# Patient Record
Sex: Male | Born: 2000 | Race: Black or African American | Hispanic: No | Marital: Single
Health system: Midwestern US, Community
[De-identification: ages and names within clinical notes are randomized; demographics above are authoritative.]

---
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
## 2019-01-26 DIAGNOSIS — Z20828 Contact with and (suspected) exposure to other viral communicable diseases: Secondary | ICD-10-CM | POA: Diagnosis not present

## 2019-05-16 DIAGNOSIS — R591 Generalized enlarged lymph nodes: Secondary | ICD-10-CM | POA: Diagnosis not present

## 2019-05-16 DIAGNOSIS — Z202 Contact with and (suspected) exposure to infections with a predominantly sexual mode of transmission: Secondary | ICD-10-CM | POA: Diagnosis not present

## 2019-05-16 DIAGNOSIS — R1909 Other intra-abdominal and pelvic swelling, mass and lump: Secondary | ICD-10-CM | POA: Diagnosis not present

## 2019-05-16 DIAGNOSIS — F17200 Nicotine dependence, unspecified, uncomplicated: Secondary | ICD-10-CM | POA: Diagnosis not present

## 2019-05-16 DIAGNOSIS — L02214 Cutaneous abscess of groin: Secondary | ICD-10-CM | POA: Diagnosis not present

## 2019-07-14 DIAGNOSIS — G8929 Other chronic pain: Secondary | ICD-10-CM | POA: Diagnosis not present

## 2019-07-14 DIAGNOSIS — R109 Unspecified abdominal pain: Secondary | ICD-10-CM | POA: Diagnosis not present

## 2019-07-14 DIAGNOSIS — R195 Other fecal abnormalities: Secondary | ICD-10-CM | POA: Diagnosis not present

## 2019-07-14 DIAGNOSIS — R197 Diarrhea, unspecified: Secondary | ICD-10-CM | POA: Diagnosis not present

## 2019-07-14 DIAGNOSIS — F17200 Nicotine dependence, unspecified, uncomplicated: Secondary | ICD-10-CM | POA: Diagnosis not present

## 2019-09-29 DIAGNOSIS — Z113 Encounter for screening for infections with a predominantly sexual mode of transmission: Secondary | ICD-10-CM | POA: Diagnosis not present

## 2019-10-24 DIAGNOSIS — R42 Dizziness and giddiness: Secondary | ICD-10-CM | POA: Diagnosis not present

## 2019-10-24 DIAGNOSIS — E162 Hypoglycemia, unspecified: Secondary | ICD-10-CM | POA: Diagnosis not present

## 2019-12-10 DIAGNOSIS — R21 Rash and other nonspecific skin eruption: Secondary | ICD-10-CM | POA: Diagnosis not present

## 2020-01-25 DIAGNOSIS — R21 Rash and other nonspecific skin eruption: Secondary | ICD-10-CM | POA: Diagnosis not present

## 2020-02-01 DIAGNOSIS — J029 Acute pharyngitis, unspecified: Secondary | ICD-10-CM | POA: Diagnosis not present

## 2020-02-01 DIAGNOSIS — Z1152 Encounter for screening for COVID-19: Secondary | ICD-10-CM | POA: Diagnosis not present

## 2020-02-01 DIAGNOSIS — Z20822 Contact with and (suspected) exposure to covid-19: Secondary | ICD-10-CM | POA: Diagnosis not present

## 2020-02-01 DIAGNOSIS — J020 Streptococcal pharyngitis: Secondary | ICD-10-CM | POA: Diagnosis not present

## 2020-02-01 DIAGNOSIS — R0981 Nasal congestion: Secondary | ICD-10-CM | POA: Diagnosis not present

## 2020-03-06 DIAGNOSIS — L309 Dermatitis, unspecified: Secondary | ICD-10-CM | POA: Diagnosis not present

## 2020-03-06 DIAGNOSIS — L810 Postinflammatory hyperpigmentation: Secondary | ICD-10-CM | POA: Diagnosis not present

## 2020-03-06 DIAGNOSIS — L301 Dyshidrosis [pompholyx]: Secondary | ICD-10-CM | POA: Diagnosis not present

## 2020-03-06 DIAGNOSIS — L853 Xerosis cutis: Secondary | ICD-10-CM | POA: Diagnosis not present

## 2020-05-05 DIAGNOSIS — R110 Nausea: Secondary | ICD-10-CM | POA: Diagnosis not present

## 2020-08-11 DIAGNOSIS — R0602 Shortness of breath: Secondary | ICD-10-CM | POA: Diagnosis not present

## 2020-08-11 DIAGNOSIS — Z79899 Other long term (current) drug therapy: Secondary | ICD-10-CM | POA: Diagnosis not present

## 2020-08-11 DIAGNOSIS — R110 Nausea: Secondary | ICD-10-CM | POA: Diagnosis not present

## 2020-08-11 DIAGNOSIS — R531 Weakness: Secondary | ICD-10-CM | POA: Diagnosis not present

## 2020-08-11 DIAGNOSIS — F17210 Nicotine dependence, cigarettes, uncomplicated: Secondary | ICD-10-CM | POA: Diagnosis not present

## 2020-08-11 DIAGNOSIS — Z20822 Contact with and (suspected) exposure to covid-19: Secondary | ICD-10-CM | POA: Diagnosis not present

## 2020-08-11 DIAGNOSIS — F419 Anxiety disorder, unspecified: Secondary | ICD-10-CM | POA: Diagnosis not present

## 2020-08-11 NOTE — Unmapped (Signed)
Formatting of this note is different from the original.    Patient Education Instructions   Name:   Joseph Giles, Joseph Giles Current Date:  08/11/2020 14:45:11   MRN: [id number]     FIN:  [id number]   The following sheet(s) are the Patient Education Leaflets for  Joseph Giles, Joseph Giles [facility]         Managing Anxiety, Adult    After being diagnosed with an anxiety disorder, you may be relieved to know why you have felt or behaved a certain way. You may also feel overwhelmed about the treatment ahead and what it will mean for your life. With care and support, you can manage this condition and recover from it.      How to manage lifestyle changes    Managing stress and anxiety    Stress is your body's reaction to life changes and events, both good and bad. Most stress will last just a few hours, but stress can be ongoing and can lead to more than just stress. Although stress can play a major role in anxiety, it is not the same as anxiety. Stress is usually caused by something external, such as a deadline, test, or competition. Stress normally passes after the triggering event has ended.     Anxiety is caused by something internal, such as imagining a terrible outcome or worrying that something will go wrong that will devastate you. Anxiety often does not go away even after the triggering event is over, and it can become long-term (chronic) worry. It is important to understand the differences between stress and anxiety and to manage your stress effectively so that it does not lead to an anxious response.    Talk with your health care provider or a counselor to learn more about reducing anxiety and stress. He or she may suggest tension reduction techniques, such as:   Music therapy. This can include creating or listening to music that you enjoy and that inspires you.     Mindfulness-based meditation. This involves being aware of your normal breaths while not trying to control your breathing. It can be done while sitting or walking.      Centering prayer. This involves focusing on a word, phrase, or sacred image that means something to you and brings you peace.     Deep breathing. To do this, expand your stomach and inhale slowly through your nose. Hold your breath for 3?5 seconds. Then exhale slowly, letting your stomach muscles relax.     Self-talk. This involves identifying thought patterns that lead to anxiety reactions and changing those patterns.     Muscle relaxation. This involves tensing muscles and then relaxing them.      Choose a tension reduction technique that suits your lifestyle and personality. These techniques take time and practice. Set aside 5?15 minutes a day to do them. Therapists can offer counseling and training in these techniques. The training to help with anxiety may be covered by some insurance plans. Other things you can do to manage stress and anxiety include:   Keeping a stress/anxiety diary. This can help you learn what triggers your reaction and then learn ways to manage your response.     Thinking about how you react to certain situations. You may not be able to control everything, but you can control your response.     Making time for activities that help you relax and not feeling guilty about spending your time in this way.     Visual imagery and yoga can  help you stay calm and relax.        Medicines    Medicines can help ease symptoms. Medicines for anxiety include:   Anti-anxiety drugs.     Antidepressants.      Medicines are often used as a primary treatment for anxiety disorder. Medicines will be prescribed by a health care provider. When used together, medicines, psychotherapy, and tension reduction techniques may be the most effective treatment.      Relationships    Relationships can play a big part in helping you recover. Try to spend more time connecting with trusted friends and family members. Consider going to couples counseling, taking family education classes, or going to family therapy. Therapy can  help you and others better understand your condition.        How to recognize changes in your anxiety    Everyone responds differently to treatment for anxiety. Recovery from anxiety happens when symptoms decrease and stop interfering with your daily activities at home or work. This may mean that you will start to:   Have better concentration and focus. Worry will interfere less in your daily thinking.     Sleep better.     Be less irritable.     Have more energy.     Have improved memory.      It is important to recognize when your condition is getting worse. Contact your health care provider if your symptoms interfere with home or work and you feel like your condition is not improving.      Follow these instructions at home:    Activity     Exercise. Most adults should do the following:  ? Exercise for at least 150 minutes each week. The exercise should increase your heart rate and make you sweat (moderate-intensity exercise).    ? Strengthening exercises at least twice a week.       Get the right amount and quality of sleep. Most adults need 7?9 hours of sleep each night.      Lifestyle     Eat a healthy diet that includes plenty of vegetables, fruits, whole grains, low-fat dairy products, and lean protein. Do not eat a lot of foods that are high in solid fats, added sugars, or salt.     Make choices that simplify your life.     Do not use any products that contain nicotine or tobacco, such as cigarettes, e-cigarettes, and chewing tobacco. If you need help quitting, ask your health care provider.     Avoid caffeine, alcohol, and certain over-the-counter cold medicines. These may make you feel worse. Ask your pharmacist which medicines to avoid.      General instructions     Take over-the-counter and prescription medicines only as told by your health care provider.     Keep all follow-up visits as told by your health care provider. This is important.        Where to find support    You can get help and support  from these sources:   Self-help groups.     Online and [employer].     A trusted spiritual leader.     Couples counseling.     Family education classes.     Family therapy.        Where to find more information    You may find that joining a support group helps you deal with your anxiety. The following sources can help you locate counselors or support groups near  you:   Mental Health [place]: www.mentalhealthamerica.net     Anxiety and Depression Association of [place] (ADAA): ProgramCam.de     [employer] Mental [employer]ness (NAMI): www.nami.org        Contact a health care provider if you:     Have a hard time staying focused or finishing daily tasks.     Spend many hours a day feeling worried about everyday life.     Become exhausted by worry.     Start to have headaches, feel tense, or have nausea.     Urinate more than normal.     Have diarrhea.      Get help right away if you have:     A racing heart and shortness of breath.     Thoughts of hurting yourself or others.    If you ever feel like you may hurt yourself or others, or have thoughts about taking your own life, get help right away. You can go to your nearest emergency department or call:    Your local emergency services (911 in the [place]     A suicide crisis helpline, such as the National Suicide Prevention Lifeline at 347-876-8431. This is open 24 hours a day.        Summary     Taking steps to learn and use tension reduction techniques can help calm you and help prevent triggering an anxiety reaction.     When used together, medicines, psychotherapy, and tension reduction techniques may be the most effective treatment.     Family, friends, and partners can play a big part in helping you recover from an anxiety disorder.      This information is not intended to replace advice given to you by your health care provider. Make sure you discuss any questions you have with your health care provider.      Document Revised: 08/11/2018  Document Reviewed: 08/11/2018  Elsevier Patient Education ? 2021 [employer]          Electronically signed by McLeod-Ecw, Notes at 02/21/2021  2:22 AM EST

## 2020-08-11 NOTE — Unmapped (Signed)
Formatting of this note is different from the original.    ;       [facility]   [address]   [place], [place] [address]   [phone]   Discharge Instructions (Patient)   _______________________________________    Name: Joseph Giles, Joseph Giles                          Current Date: 08/11/2020 14:45:10  DOB:  Aug 09, 2000                              MRN: [id number]                   FIN: [id number]  Diagnosis: 1:Anxiety    Visit Date: 08/11/2020 09:09:30 [place]  Address: [address] [place] [id number]  Phone:     Primary Care Provider:      Name:       Phone:        Emergency Department Providers:          Primary Physician:       Comment:     Tonye Becket has been given the following list of follow-up instructions, prescriptions, and patient education materials:    Follow-up Instructions:      With: Address: When:   Return to Emergency Department  Within 3 to 5 days   Comments:   Take all medications as prescribed.  Follow-up with primary care provider in 3 to 5 days for recheck.  Return for new or worsening symptoms.         Patient Education Materials:  Managing Anxiety, Adult        Managing Anxiety, Adult    After being diagnosed with an anxiety disorder, you may be relieved to know why you have felt or behaved a certain way. You may also feel overwhelmed about the treatment ahead and what it will mean for your life. With care and support, you can manage this condition and recover from it.      How to manage lifestyle changes    Managing stress and anxiety    Stress is your body's reaction to life changes and events, both good and bad. Most stress will last just a few hours, but stress can be ongoing and can lead to more than just stress. Although stress can play a major role in anxiety, it is not the same as anxiety. Stress is usually caused by something external, such as a deadline, test, or competition. Stress normally passes after the triggering event has ended.      Anxiety is caused by something internal, such as imagining a terrible outcome or worrying that something will go wrong that will devastate you. Anxiety often does not go away even after the triggering event is over, and it can become long-term (chronic) worry. It is important to understand the differences between stress and anxiety and to manage your stress effectively so that it does not lead to an anxious response.    Talk with your health care provider or a counselor to learn more about reducing anxiety and stress. He or she may suggest tension reduction techniques, such as:   Music therapy. This can include creating or listening to music that you enjoy and that inspires you.     Mindfulness-based meditation. This involves being aware of your normal breaths while not trying to control your breathing.  It can be done while sitting or walking.     Centering prayer. This involves focusing on a word, phrase, or sacred image that means something to you and brings you peace.     Deep breathing. To do this, expand your stomach and inhale slowly through your nose. Hold your breath for 3?5 seconds. Then exhale slowly, letting your stomach muscles relax.     Self-talk. This involves identifying thought patterns that lead to anxiety reactions and changing those patterns.     Muscle relaxation. This involves tensing muscles and then relaxing them.      Choose a tension reduction technique that suits your lifestyle and personality. These techniques take time and practice. Set aside 5?15 minutes a day to do them. Therapists can offer counseling and training in these techniques. The training to help with anxiety may be covered by some insurance plans. Other things you can do to manage stress and anxiety include:   Keeping a stress/anxiety diary. This can help you learn what triggers your reaction and then learn ways to manage your response.     Thinking about how you react to certain situations. You may not be able to  control everything, but you can control your response.     Making time for activities that help you relax and not feeling guilty about spending your time in this way.     Visual imagery and yoga can help you stay calm and relax.        Medicines    Medicines can help ease symptoms. Medicines for anxiety include:   Anti-anxiety drugs.     Antidepressants.      Medicines are often used as a primary treatment for anxiety disorder. Medicines will be prescribed by a health care provider. When used together, medicines, psychotherapy, and tension reduction techniques may be the most effective treatment.      Relationships    Relationships can play a big part in helping you recover. Try to spend more time connecting with trusted friends and family members. Consider going to couples counseling, taking family education classes, or going to family therapy. Therapy can help you and others better understand your condition.        How to recognize changes in your anxiety    Everyone responds differently to treatment for anxiety. Recovery from anxiety happens when symptoms decrease and stop interfering with your daily activities at home or work. This may mean that you will start to:   Have better concentration and focus. Worry will interfere less in your daily thinking.     Sleep better.     Be less irritable.     Have more energy.     Have improved memory.      It is important to recognize when your condition is getting worse. Contact your health care provider if your symptoms interfere with home or work and you feel like your condition is not improving.      Follow these instructions at home:    Activity     Exercise. Most adults should do the following:  ? Exercise for at least 150 minutes each week. The exercise should increase your heart rate and make you sweat (moderate-intensity exercise).    ? Strengthening exercises at least twice a week.       Get the right amount and quality of sleep. Most adults need 7?9 hours of sleep  each night.      Lifestyle     Eat a healthy  diet that includes plenty of vegetables, fruits, whole grains, low-fat dairy products, and lean protein. Do not eat a lot of foods that are high in solid fats, added sugars, or salt.     Make choices that simplify your life.     Do not use any products that contain nicotine or tobacco, such as cigarettes, e-cigarettes, and chewing tobacco. If you need help quitting, ask your health care provider.     Avoid caffeine, alcohol, and certain over-the-counter cold medicines. These may make you feel worse. Ask your pharmacist which medicines to avoid.      General instructions     Take over-the-counter and prescription medicines only as told by your health care provider.     Keep all follow-up visits as told by your health care provider. This is important.        Where to find support    You can get help and support from these sources:   Self-help groups.     Online and [employer]community organizations.     A trusted spiritual leader.     Couples counseling.     Family education classes.     Family therapy.        Where to find more information    You may find that joining a support group helps you deal with your anxiety. The following sources can help you locate counselors or support groups near you:   Mental Health America: www.mentalhealthamerica.net     Anxiety and Depression Association of [place] (ADAA): ProgramCam.dewww.adaa.org     The First Americanational Alliance on Mental Illness (NAMI): www.nami.org        Contact a health care provider if you:     Have a hard time staying focused or finishing daily tasks.     Spend many hours a day feeling worried about everyday life.     Become exhausted by worry.     Start to have headaches, feel tense, or have nausea.     Urinate more than normal.     Have diarrhea.      Get help right away if you have:     A racing heart and shortness of breath.     Thoughts of hurting yourself or others.    If you ever feel like you may hurt yourself or others, or have thoughts  about taking your own life, get help right away. You can go to your nearest emergency department or call:    Your local emergency services (911 in the [place]     A suicide crisis helpline, such as the National Suicide Prevention Lifeline at (925)290-85741-347-708-4218. This is open 24 hours a day.        Summary     Taking steps to learn and use tension reduction techniques can help calm you and help prevent triggering an anxiety reaction.     When used together, medicines, psychotherapy, and tension reduction techniques may be the most effective treatment.     Family, friends, and partners can play a big part in helping you recover from an anxiety disorder.      This information is not intended to replace advice given to you by your health care provider. Make sure you discuss any questions you have with your health care provider.      Document Revised: 08/11/2018 Document Reviewed: 08/11/2018  Elsevier Patient Education ? 2021 Elsevier Inc.        Allergies: No Known Allergies     Medication Information:  [facility]  Physicians provided you with a complete list of medications post discharge.  If you have been instructed to stop taking a medication, please ensure you also follow up with this information to your Primary Care Physician.  Unless otherwise noted, please continue to take medications as prescribed prior to your Emergency Room visit.  Any specific questions regarding your chronic medications and dosages should be discussed with your physician(s) and pharmacist.           New Medications and Prescriptions  Printed Prescriptions  busPIRone (busPIRone 5 mg oral tablet) 1 tab Oral (given by mouth) 2 times a day for 5 Days. Refills: 0.  Last Dose:____________________Next Dose:____________________      Comment:       General Instructions for Healthy Living and Maintaining My Health  IS IT A STROKE?   Act FAST and Check for these signs:      FACE Does the face look uneven?       ARM Does one arm drift down?       SPEECH Does  their speech sound strange?       TIME Call 9-1-1 at any sign of stroke     Heart Attack Signs  Chest discomfort: Most heart attacks involve discomfort in the center of the chest and lasts more than a few minutes,or goes away and comes back. It can feel like uncomfortable pressure, squeezing, fullness or pain.  Discomfort in upper body: Symptoms can include pain or discomfort in one or both arms, back, neck, jaw or stomach.  Shortness of breath:With or without discomfort.  Other signs: Breaking out in a cold sweat, nausea, or light-headed.  MINUTES DO MATTER! If you experience any of these heart attack warning signs, call 9-1-1 for immediate medical attention!    Smoking:  - No smoking / No Tobacco Products / Avoid Exposure to Second Hand Smoke  - Surgeon General's Warning: Quitting smoking now greatly reduces serious risk to your health.    Obesity / Weight Monitoring:  - Obesity greatly increases your risk of illness  - A healthy diet, regular physical exercise and weight monitoring are important for maintaining a healthy lifestyle.  - You may be retaining fluid if you have history of congestive heart failure or experience any of the following symptoms:    - Weight gain of 3-5 lbs in 1-2 days,swelling in your hands or feet or shortness of breath while lying flat in bed.  **Please call your doctor as soon as you notice any of these symptoms; do not wait until your next office visit.**    Reduce potential for lethal means  Family/Friends should help in securing or removing from house or car all means of committing suicide impulsively.   In Case of Emergency Dial 911 or nearest Emergency Room  National Suicide Prevention Life Line    www.SuicidePreventionLifeLine.org    1-800-273-TALK (8255)   National Alliance on Mental Illness      www.nami.org    989-034-9773 or (1-647-113-3147)   Substance Abuse and Mental Health Services Administration    1-877-SAMHSA-7         CelebSpecial.com.pt     [phone])       **For Help with Substance Abuse please contact the resource below most convenient to you:**  Substance Abuse Treatment (Outpatient)  Location / Organization Name    Phone Number            [facility]    [phone]   [facility]  [phone]   [facility] Substance Use Disorder Treatment/MAT Program    [phone]   [facility]    [phone]   [facility]    [phone]   [facility]    [phone]   [facility] [place] [facility] [place]    [phone]              **[address] [place]**    [facility]    [phone]            **[facility]**    [facility]    [phone]            **[place]**    [facility] [place]    [phone]   [place]    [phone]   [facility] [place] [facility] [place]    [phone]            [facility]**    [facility]    [phone]   Center for [facility]    [phone], [facility]    [phone]   [facility]    [phone]   [facility]    [phone]            [facility]**    [facility] [place]    [phone]   [facility]    [phone]   [facility]    [phone]            **[facility]stance Use Disorder [facility]/MAT Program    [phone]   [phone]   Alcohol and Drug Abuse Main Office    [phone]       Substance Abuse Treatment (Inpatient)  Location / Organization Name    Phone Number            [facility]    [phone]   [facility]    [phone]   [facility]    [phone]   [facility]    [phone]   [facility]    [phone]            [facility]**    [facility]    [phone]            [facility]**    Drug Re[facility] [place]    [phone]            [facility]    [phone]   [facility])    [phone]   [facility] ([facility])    [phone]   [facility] [address]    [phone]   [facility]    [phone]   [facility]    [phone]   [facility] Substance Abuse Treatment  Location / Organization Name    Phone Number     [facility] [place])    [phone]   [facility] [place])    [phone]   [facility]ox [place])    [phone]       [facility]   [address]   [place], [place] [address]   [phone]   Discharge Instructions (Patient)   _______________________________________    Name: Joseph Giles, Joseph Giles  Current Date: 08/11/2020 14:45:10 [place]  DOB:  2000-07-22                              MRN: [id number]                   FIN: [id number]  Diagnosis: 1:Anxiety1:Anxiety    I, Tonye Becket,  have reviewed the discharge instructions with a [facility] healthcare representative. I have received and understand my discharge instructions.    We are pleased to have been able to provide you with care today. Please review these instructions when you return home in order to better understand your diagnosis and the necessary further treatment and precautions related to your condition.    In most cases, treatment in an Emergency Department is intended to be temporary in nature. In general, any additional treatment is to be given by your family doctor or the physician to whom you have been referred upon discharge from the Emergency Department.    These instructions are temporary. If you have any questions or problems after leaving the Emergency Department, please notify your physician or the physician to whom you have been referred, or return to the Emergency Department if your condition worsens.    Reminder: If you have been referred to another physician for follow-up care, your  treatment physician may expect payment at time of service. Please contact the physician's office prior to treatment to clarify payment options.    X-RAYS and LAB TESTS:  If you had x-rays today, they were read by a radiologist. If you had a culture done, it will take 24 to 72 hours to get the results. If there is a change in the x-ray diagnosis or a positive culture, we will contact you. Please verify your current phone number prior to discharge at the check out desk.    MEDICATIONS:  If you received a prescription for medication(s) today, it is important that when you fill this, you let the pharmacist know all the other medications that you are on and any allergies you might have. It is also important that you notify your follow-up physician of all your medications including the prescriptions you may receive today.  _____________________________________________________________________________      __________________________________  Patient (or Guardian) Signature  08/11/2020 14:45:10      __________________________________  Primary Nurse Signature  08/11/2020 14:45:10      [facility]   [address]   [place], [place] [address]   [phone]   Discharge Instructions (Patient)   _______________________________________    Name: Tonye Becket                          Current Date: 08/11/2020 14:45:10 [place]  DOB:  09-20-2000                              MRN: [id number]                   FIN: [id number]  Diagnosis: 1:Anxiety1:Anxiety    I, Tonye Becket,  have reviewed the discharge instructions with a [facility] healthcare representative. I have received and understand my discharge instructions.    We are pleased to have been able to provide you with care today. Please review these instructions when you return home in order to better understand your diagnosis and the necessary further treatment  and precautions related to your condition.    In most cases, treatment in an  Emergency Department is intended to be temporary in nature. In general, any additional treatment is to be given by your family doctor or the physician to whom you have been referred upon discharge from the Emergency Department.    These instructions are temporary. If you have any questions or problems after leaving the Emergency Department, please notify your physician or the physician to whom you have been referred, or return to the Emergency Department if your condition worsens.    Reminder: If you have been referred to another physician for follow-up care, your treatment physician may expect payment at time of service. Please contact the physician's office prior to treatment to clarify payment options.    X-RAYS and LAB TESTS:  If you had x-rays today, they were read by a radiologist. If you had a culture done, it will take 24 to 72 hours to get the results. If there is a change in the x-ray diagnosis or a positive culture, we will contact you. Please verify your current phone number prior to discharge at the check out desk.    MEDICATIONS:  If you received a prescription for medication(s) today, it is important that when you fill this, you let the pharmacist know all the other medications that you are on and any allergies you might have. It is also important that you notify your follow-up physician of all your medications including the prescriptions you may receive today.  _____________________________________________________________________________      __________________________________  Patient (or Guardian) Signature  08/11/2020 14:45:10      __________________________________  Primary Nurse Signature  08/11/2020 14:45:10    Electronically signed by [facility], Notes at 02/21/2021  2:22 AM EST

## 2020-08-11 NOTE — Unmapped (Signed)
Formatting of this note is different from the original.    ED Triage Part 1 - Adult Entered On:  08/11/2020 9:50 EDT    Performed On:  08/11/2020 9:48 EDT by Montez Morita, RN, Fanny Skates) S           ED Triage Part 1 - Adult   Chief Complaint :   headache/ nausea   [place] Mode of Arrival :   Private vehicle   ED Allergies/Med Hx Section :   Document assessment   ED RFV/Problems/Surgical History :   Document assessment   Have you been pregnant in the past 12 months? :   N/A   ID Risk Screening :   Document assessment   Temperature Oral :   36.9 Deg C(Converted to: 98.4 Deg F)    Systolic Blood Pressure :   125 mmHg   Diastolic Blood Pressure :   82 mmHg   Heart Rate Monitored :   75 bpm   Respiratory Rate :   22 br/min (HI)    SpO2 :   98 %   Oxygen Therapy :   Room air   Pain Present :   Yes actual or suspected pain   Montez Morita, RN, [name] [name]) S - 08/11/2020 9:48 EDT   DCP GENERIC CODE   Tracking Acuity :   3 - Urgent   Tracking Group :   [facility] ED Tracking Group   Montez Morita, RN, Gwendolyn [name]) S - 08/11/2020 9:48 EDT   Weight Type 2 :   Stated   Weight Measured :   67 kg(Converted to: 147 lb 11 oz)    Height/Length Measured :   189 cm(Converted to: 6 ft 2 in)    Body Mass Index Measured :   19 kg/m2   Montez Morita, RN, Gwendolyn Gnadenhutten) S - 08/11/2020 9:48 EDT   ED Triage Allergies/Meds   (As Of: 08/11/2020 09:50:10 EDT)   Allergies (Active)   No Known Allergies  Estimated Onset Date:   Unspecified ; Created ByMontez Morita, RN, Gwendolyn (Gwen) S; Reaction Status:   Active ; Category:   Drug ; Substance:   No Known Allergies ; Type:   Allergy ; Updated By:   Montez Morita, RN, Fanny Skates) S; Reviewed Date:   08/11/2020 9:48 EDT      [place] for Visit/Problems/Surgical History   (As Of: 08/11/2020 09:50:10 EDT)   Diagnoses(Active)    Headache  Date:   08/11/2020 ; Diagnosis Type:   Reason For Visit ; Confirmation:   Complaint of ; Clinical Dx:   Headache ; Classification:   Nursing ; Clinical Service:   Non-Specified ; Code:    PNED ; Probability:   0 ; Diagnosis Code:   [id number]         -    Procedure History   (As Of: 08/11/2020 09:50:10 EDT)     ID Risk Screen   COVID-19 Screening :   None   Montez Morita, RN, [name] [name]) S - 08/11/2020 9:48 EDT   Tuberculosis Risk Factors   Diarrhea :   No   New or Worsening Cough :   No   Abdominal (Stomach Pain) :   No   Abnormal Bleeding :   No   Arthralgia :   No   Chills :   No   Conjunctivitis :   No   Difficulty Breathing :   No   Fatigue :   No   Fever :   No   Headache :  No   Hemoptysis :   No   History of CRE :   No   History of MDRO :   No   Illness With Generalized Rash :   No   Muscle Pain :   No   Night Sweats :   No   Photophobia :   No   Sore Throat :   No   Vomiting :   No   Weakness/Numbness :   No   Wheezing :   No   Unintentional Wt Loss Greater Than 10 lbs :   No   Montez Morita, RN, Gwendolyn McBain) S - 08/11/2020 9:48 EDT   Infectious Disease Symptoms   Recent Exposure to Communicable Disease :   No   Montez Morita, RN, [name] [name]) S - 08/11/2020 9:48 EDT     Electronically signed by [facility], Notes at 02/21/2021  2:22 AM EST

## 2020-08-11 NOTE — Unmapped (Signed)
Formatting of this note might be different from the original.    Patient states weakness, shortness of breath, and fatigue with headache. CXR and COVID ordered while patient waits for room in main ED.    Electronically signed by Fredderick Severance, FNP at 08/11/2020 12:14 PM EDT

## 2020-08-11 NOTE — Unmapped (Signed)
Formatting of this note might be different from the original.    Valuables/Belongings Entered On:  08/11/2020 14:36 EDT    Performed On:  08/11/2020 14:36 EDT by Montez Morita, RN, Fanny Skates) S           Valuables/Belongings   Belongings Status on Discharge/Transfer :   Patient   [name], [name] [name]) S - 08/11/2020 14:36 EDT     Electronically signed by [facility], Notes at 02/21/2021  2:22 AM EST

## 2020-08-11 NOTE — Unmapped (Signed)
Formatting of this note is different from the original.    ED Triage Part 2 - Adult Entered On:  08/11/2020 9:51 EDT    Performed On:  08/11/2020 9:48 EDT by Montez Morita, RN, Fanny Skates) S           General Assessment   ED Document Fall Risk :   Document Fall Risk   Behavioral Health Concern :   Yes   Document Glasgow Coma Scale :   Open glasgow coma assessment   Document advance directive :   Document Advance Directive   Document Social History :   Open social history   Montez Morita, Charity fundraiser, Gwendolyn [place]) S - 08/11/2020 9:48 EDT   MDRO History/Risk Factors   New or Worsening Cough :   No   Recent Exposure to Communicable Disease :   No   Montez Morita, RN, [name] [facility]) S - 08/11/2020 9:48 EDT   Pregnancy Status :   N/A   ED Menstrual History :   N/A   Preferred Languages :   English   Preferred Mode of Communication :   Verbal   Montez Morita, RN, Gwendolyn [place]) S - 08/11/2020 9:48 EDT   Social History   Social History   (As Of: 08/11/2020 09:51:23 EDT)   Tobacco:        Smoking tobacco use: 5-9 cigarettes (1/4 - 1/2 pack)/day in last 30 days.   (Last Updated: 08/11/2020 09:50:45 EDT by Montez Morita, RN, Gwendolyn [name]) S)        Electronic Cigarette:        E-Cigarette Use: Never.   (Last Updated: 08/11/2020 09:50:56 EDT by Montez Morita, RN, Gwendolyn [name]) S)        Alcohol:        Past, Monia Sabal, Liquor   (Last Updated: 08/11/2020 09:51:07 EDT by Montez Morita, RN, [name] [name]) S)        Substance Use:        Past, Marijuana   (Last Updated: 08/11/2020 09:51:15 EDT by Montez Morita, RN, Gwendolyn (Gwen) S)          Glasgow Coma   Eye Opening :   Spontaneously   Best Verbal Response :   Oriented   Best Motor Response :   Obeys commands   Glasgow Coma Score :   [id number] RN, Gwendolyn Hinckley) S - 08/11/2020 9:48 EDT   Lattie Corns Fall Risk   History of Fall in Last 3 Months :   No   Presence of Secondary Diagnosis :   No   Use of Ambulatory Aid :   None, bedrest, wheelchair, nurse   IV or IV Lock Morse :   No   Gait/ Transferring :   Normal, bedrest,  immobile   Mental Status :   Oriented to own ability   Score :   0    Montez Morita, RN, [name] [name]) S - 08/11/2020 9:48 EDT   Image 1 -  Images currently included in the form version of this document have not been included in the text rendition version of the form.   Advance Directive   Advance Directive :   No   Montez Morita, RN, [name] [name]) S - 08/11/2020 9:48 EDT   CSSRS Screen   CSSRS Last Visit Wish to be Dead :   No   CSSRS Last Visit Suicidal Thoughts :   No   CSSRS Last Visit Suicide Behavior :   No   Montez Morita, RN, [name] [facility]) S - 08/11/2020 9:51 EDT  CSSRS Screen Results :   No qualifying data available.    Montez Morita, RN, Gwendolyn [place]) S - 08/11/2020 9:48 EDT     Electronically signed by McLeod-Ecw, Notes at 02/21/2021  2:22 AM EST

## 2020-08-11 NOTE — Unmapped (Signed)
Formatting of this note might be different from the original.  CODING SUMMARY Coding Date: 08/17/2020 Coding Status: Final  Patient Name:  Birth Date:  Age:  Sex:  Patient Type:  Joseph Giles, Joseph Giles  April 26, 2000  20 Years  Male  Emergency  Physician Name:  FIN:  MRN:  Payer:  Lucas Mallow, MDRaul Del  [id number]  [id number]  BCBS  Facility:  Discharge Disposition:  Admit Date:  Discharge Date:  [facility] or Self Care  08/11/2020  08/11/2020 GR[facility] APC Description 5521 Level 1 Imaging without Contrast 5024 Level 4 Type A ED Visits DIAGNOSIS Code POA Description Type R53.1 Weakness Admit R53.1 Weakness RFV R11.0 Nausea RFV Principal F41.9 Anxiety disorder, unspecified Final Secondary F17.210 Nicotine dependence, cigarettes, uncomplicated Final Z20.822 Contact with and (suspected) exposure to COVID-19 Final Z79.899 Other long term (current) drug therapy Final PROCEDURE NOTE: The code number assigned matches the documented diagnosis and/or procedure in the patient's chart. However, the narrative phrase printed from the coding software may appear abbreviated, or result in slightly different terminology. Coded By: Denzil Hughes Date Saved: 08/17/2020 07:52 am  Electronically signed by McLeod-Ecw, Notes at 02/21/2021  2:22 AM EST

## 2020-08-11 NOTE — Unmapped (Signed)
Formatting of this note might be different from the original.    ED Disposition Documentation Entered On:  08/11/2020 14:42 EDT    Performed On:  08/11/2020 14:36 EDT by Montez Morita, RN, [name] [name]) S           Disposition Documentation   Patient Condition-Disposition :   Satisfactory   ED Procedural Sedation :   No   ED Restraint/Seclusion :   No   ED Discharged to :   Home with Self Care/Family   ED Discharge Documentation :   Open Discharge Documentation   Montez Morita, RN, [name] [name]) S - 08/11/2020 14:36 EDT   Discharge   Discharged to care of :   Self   Mode of Discharge :   Ambulatory   Discharge Transportation :   Private vehicle   Individuals Taught :   Patient   Teaching Method - ED :   Written/printout   Barriers to Learning :   None evident   Discharge comments ED :   SKIN WARM AND DRY. RESP EVEN AND NONLABORED/ GAIT STEADY AND BALANCEDD TO LOBBY/ VERBLIES UNDERSTANDING OF DISCHARGE   Montez Morita, RN, Gwendolyn Sims) S - 08/11/2020 14:36 EDT     Electronically signed by McLeod-Ecw, Notes at 02/21/2021  2:22 AM EST

## 2020-08-11 NOTE — Unmapped (Signed)
Formatting of this note might be different from the original.  EXAM: PORTABLE CHEST - 1 VIEW  CLINICAL DATA: Shortness of breath; sob  COMPARISON: None.  FINDINGS: PA view of the chest demonstrates the cardiac and mediastinal silhouettes are unremarkable for age. The pulmonary vasculature is normal. The lungs are clear. The regional skeleton is age-appropriate.  IMPRESSION: 1. No acute disease.  SIGNATURE:  Electronically Signed By: Malachi Pro M.D. On: 08/11/2020 13:57  Electronically signed by Malachi Pro, MD at 08/11/2020  1:58 PM EDT

## 2020-08-11 NOTE — Unmapped (Signed)
Formatting of this note might be different from the original.    No qualifying data available.    Electronically signed by McLeod-Ecw, Notes at 02/21/2021  2:22 AM EST

## 2020-08-11 NOTE — ED Provider Notes (Signed)
Formatting of this note might be different from the original.  Basic Information   Time Seen:   Tennis Ship, FNP, SARA / 08/11/2020 14:15  Chief Complaint    headache/ nausea History of Present Illness     Arrival Information     Reviewed and Agreed with Available Nursing Documentation: Yes     Historian: Patient     Arrived from:Home     Treatment Prior to Arrival:     20 year old male patient with complaint of weakness, breath, fatigue.  Patient states that he has been under a lot of anxiety and stress recently and thinks this is contributing to symptoms.  States not taking any medication for stress or anxiety.  Denies pain at current.  But states he has had headache off and on. Review of Systems     Constitutional: (-) Chills, (-) Fever, (-) Night Sweats, (-) Weight Loss     Eye: (-) Drainage, (-) Eye Pain, (-) Redness, (-) Vision Change     ENT: (-) Dental Pain, (-) Ear Pain, (-) Epistaxis, (-) Jaw Pain, (-) Rhinorrhea, (-) Sore Throat     Cardiovascular: (-) Chest Pain, (-) Leg Swelling, (-) Orthopnea, (-) Palpitations, (-) Syncope     Respiratory: (-) Cough, (-) Dyspnea, (-) Dyspnea on Exertion, (-) Hemoptysis, (-) Wheezing     GI: (-) Abdominal Pain, (-) Constipation, (-) Diarrhea, (-) Melena, (+) Nausea, (-) Rectal Pain, (-) Vomiting     Genitourinary: (-) Discharge, (-) Dysuria, (-) Flank Pain, (-) Hematuria, (-) Urinary Frequency, (-) Urinary Retention     Musculoskeletal: (-) Back Pain, (-) Calf Tenderness, (-) Joint Pain, (-) Neck Pain     Neurologic: (+) Headache, (-) Neck Stiffness, (-) Numbness, (-) Slurred Speech, (+) Weakness, (-) Seizure     Skin: (-) Diaphoretic, (-) Jaundice, (-) Pruritus, (-) Rash     Hematologic and Lymphatic: (-) Easy Bruising, (-) Lymphadenopathy, (-) Prolonged Bleeding     Allergic and Immunologic: (-) Hives, (-) Sneezing, (-) Tongue Swelling     Endocrine: (-) Excessive Thirst, (-) Fatigue, (-) Nocturia     Psychiatric: (+) Anxiety, (-) Depression, (-) Hallucination, (-) Suicidal  Thoughts       Physical Exam  Vitals & Measurements   T: 36.9  C (Oral)  HR: 75(Monitored)  RR: 22  BP: 125/82  SpO2: 98%    HT: 189 cm  WT: 67.000 kg  BMI: 19      Constitutional: Alert, well nourished,  NAD     Head: NCAT     Eyes: EOMI, PERRL     Neck: No JVD, No Bruits     Cardiovascular:  RRR ,  No Edema ,  normal S1, S2     Respiratory:  CTA B/L ,  no stridor     Gastrointestinal: Soft,  nontender , non-distended     Neurologic: Strength 5 out of 5 throughout, no focal deficits, sensation intact throughout     Skin: warm, dry, no rash     Lymphatic: No lymphadenopathy     Psychiatric: Normal affect, cooperative Medical Decision Making     This patient presents with generalized weakness and fatigue likely secondary to anxiety.      Doubt intrinsic renal dysfunction or obstructive nephropathy. Considered alternate etiologies of the patient?s symptoms including infectious processes, severe metabolic derangements or electrolyte abnormalities, ischemia/ACS, heart failure, and intracranial/central processes but think these are unlikely given the history and physical exam.     COVID/Flu negative.     Chest  x-ray read by radiology with no acute disease.     Patient given prescription for BuSpar and instructed to take when he feels like he is having anxiety.     Instructed to follow-up with PCP within 3 to 5 days for recheck.  RTC new or worsening symptoms. Assessment/Plan      1. Anxiety (F41.9)        See MDM Data Review/Billing  Supervising physician Dr. Lucas Mallow. Problem List/Past Medical History   Ongoing    No qualifying data   Historical    No qualifying data Medications   Inpatient    No active inpatient medications   Home    busPIRone 5 mg oral tablet, 5 mg= 1 tab, Oral, BID Allergies   No Known Allergies Social History    Alcohol     Past, Beer, Liquor, 08/11/2020    Electronic Cigarette     E-Cigarette Use: Never., 08/11/2020    Substance Use     Past, Marijuana, 08/11/2020    Tobacco     Smoking tobacco use:  5-9 cigarettes (1/4 - 1/2 pack)/day in last 30 days., 08/11/2020 Lab Results      Microbiology Molecular.       LATEST RESULTS       Influenza A RNA, PCR       08/11/20 12:13       Flu A NEGATIVE       Influenza B RNA, PCR       08/11/20 12:13       Flu B NEGATIVE       SARS CoV-2, RT-PCR       08/11/20 12:13       Negative       Respiratory Syncytial Virus, PCR       08/11/20 12:13       Negative     Diagnostic Results  XR Chest 1 View (PA AP PORTABLE)     08/11/20 13:56:53  IMPRESSION:  1. No acute disease.  SIGNATURE:  Electronically Signed  By: Malachi Pro M.D.  On: 08/11/2020 13:57     Signed By: Marisa Sprinkles, MD, Danelle Earthly  Electronically signed by Fredderick Severance, FNP at 08/12/2020  8:54 PM EDT

## 2020-08-11 NOTE — Unmapped (Signed)
Formatting of this note might be different from the original.    MSE Entered On:  08/11/2020 12:16 EDT    Performed On:  08/11/2020 12:14 EDT by Tennis Ship, FNP, SARA           MSE   ED Provider Time Seen :   Yes   ED MSE Documentation :   Patient states weakness, shortness of breath, and fatigue with headache. CXR and COVID ordered while patient waits for room in main ED.    [place], FNP, SARA - 08/11/2020 12:14 EDT     Electronically signed by Fredderick Severance, FNP at 08/11/2020 12:14 PM EDT

## 2020-08-11 NOTE — Unmapped (Signed)
Formatting of this note is different from the original.    [facility]  Emergency Department  Clinical Summary  _____________________________________________    PERSON INFORMATION  Name: Joseph Giles, Joseph Giles Age:  20 Years DOB: 08-Aug-2000   Sex: Male Language: English PCP:    Marital Status: Single Phone:  Med Service: Emergency Medicine   MRN: [id number] Acct# [id number] Arrival: 08/11/2020 09:09:30   Visit Reason: Headache; lightheaded Acuity: 3 - Urgent LOS: 000 03:51   Address:     [address] [place] [id number]   Diagnosis:     1:Anxiety  Medications Administered:    Radiology Orders:          XR Chest 1 View (PA AP PORTABLE) 08/11/20 12:10:00 EDT, Stat, Reason: shorntess of breath    Laboratory Orders:          SARS-CoV-2/Flu AB/RSV, PCR Nasopharyngeal Swab, Stat Collect, Indication for Testing: Direct Admit/ED Patient, 08/11/20 12:10:00 EDT, Once, Nurse collect, Print Label    Lab and Rad:          Laboratory or Other Results This Visit (last charted value for your 08/11/2020 visit)        Microbiology Molecular           08/11/2020  12:13 PM              Influenza A RNA, PCR: Flu A NEGATIVE               Influenza B RNA, PCR: Flu B NEGATIVE               SARS CoV-2, RT-PCR: Negative               Respiratory Syncytial Virus, PCR: Negative         Diagnostic Radiology           08/11/2020  12:49 PM              XR Chest 1 View (PA AP PORTABLE): XR Chest 1 View (PA AP PORTABLE)     Medications:              PROVIDER INFORMATION        Provider Role Assigned [place], FNP, SARA ED Midlevel 08/11/2020 14:15:36      Attending Physician:  Lucas Mallow, MD, Ali Lowe, MD, Raul Del    Consulting Doc      VITALS INFORMATION  Vital Sign Triage Latest   Temp Oral ORAL_1%>36.9 Deg C ORAL%>36.9 Deg C   Temp Temporal TEMPORAL_1%> TEMPORAL%>   Temp Intravascular INTRAVASCULAR_1%> INTRAVASCULAR%>   Temp Axillary AXILLARY_1%> AXILLARY%>   Temp Rectal RECTAL_1%> RECTAL%>   02 Sat 98 % 98 %    Respiratory Rate RATE_1%>22 br/min RATE%>22 br/min   Peripheral Pulse Rate PULSE RATE_1%> PULSE RATE%>   Apical Heart Rate HEART RATE_1%> HEART RATE%>   Blood Pressure BLOOD PRESSURE_1%>125 mmHg / BLOOD PRESSURE_1%>82 mmHg BLOOD PRESSURE%>125 mmHg / BLOOD PRESSURE%>82 mmHg             Allergies      No Known Allergies              Immunizations      No Immunizations Documented This Visit        DISCHARGE INFORMATION   Discharge Disposition: Home or Self Care   Discharge Location:   Home   Discharge Date and Time:   08/11/2020 13:00:00   ED Checkout Date and Time:  08/11/2020 13:00:00    DEPART REASON INCOMPLETE INFORMATION              Problems      No Problems Documented            Smoking Status      5-9 cigarettes (1/4 - 1/2 pack)/day in last 30 days      PATIENT EDUCATION INFORMATION   Instructions:      Managing Anxiety, Adult     Follow up:        With: Address: When:   Return to Emergency Department  Within 3 to 5 days   Comments:   Take all medications as prescribed.  Follow-up with primary care provider in 3 to 5 days for recheck.  Return for new or worsening symptoms.             Electronically signed by McLeod-Ecw, Notes at 02/21/2021  2:22 AM EST

## 2020-09-14 DIAGNOSIS — R5383 Other fatigue: Secondary | ICD-10-CM | POA: Diagnosis not present

## 2020-09-14 DIAGNOSIS — F419 Anxiety disorder, unspecified: Secondary | ICD-10-CM | POA: Diagnosis not present

## 2020-09-14 DIAGNOSIS — R0602 Shortness of breath: Secondary | ICD-10-CM | POA: Diagnosis not present

## 2020-09-14 DIAGNOSIS — I361 Nonrheumatic tricuspid (valve) insufficiency: Secondary | ICD-10-CM | POA: Diagnosis not present

## 2020-11-21 DIAGNOSIS — R634 Abnormal weight loss: Secondary | ICD-10-CM | POA: Diagnosis not present

## 2020-11-21 DIAGNOSIS — Z131 Encounter for screening for diabetes mellitus: Secondary | ICD-10-CM | POA: Diagnosis not present

## 2020-11-21 DIAGNOSIS — Z0001 Encounter for general adult medical examination with abnormal findings: Secondary | ICD-10-CM | POA: Diagnosis not present

## 2020-11-21 DIAGNOSIS — F419 Anxiety disorder, unspecified: Secondary | ICD-10-CM | POA: Diagnosis not present

## 2020-11-21 DIAGNOSIS — Z1329 Encounter for screening for other suspected endocrine disorder: Secondary | ICD-10-CM | POA: Diagnosis not present

## 2020-11-21 DIAGNOSIS — Z136 Encounter for screening for cardiovascular disorders: Secondary | ICD-10-CM | POA: Diagnosis not present

## 2020-11-21 DIAGNOSIS — R7611 Nonspecific reaction to tuberculin skin test without active tuberculosis: Secondary | ICD-10-CM | POA: Diagnosis not present

## 2020-12-04 DIAGNOSIS — F419 Anxiety disorder, unspecified: Secondary | ICD-10-CM | POA: Diagnosis not present

## 2021-03-03 DIAGNOSIS — R42 Dizziness and giddiness: Secondary | ICD-10-CM | POA: Diagnosis not present

## 2021-03-03 DIAGNOSIS — J029 Acute pharyngitis, unspecified: Secondary | ICD-10-CM | POA: Diagnosis not present

## 2021-03-03 DIAGNOSIS — I319 Disease of pericardium, unspecified: Secondary | ICD-10-CM | POA: Diagnosis not present

## 2021-03-03 DIAGNOSIS — F419 Anxiety disorder, unspecified: Secondary | ICD-10-CM | POA: Diagnosis not present

## 2021-03-03 DIAGNOSIS — F17200 Nicotine dependence, unspecified, uncomplicated: Secondary | ICD-10-CM | POA: Diagnosis not present

## 2021-08-13 DIAGNOSIS — B20 Human immunodeficiency virus [HIV] disease: Secondary | ICD-10-CM | POA: Diagnosis not present

## 2021-08-13 DIAGNOSIS — R946 Abnormal results of thyroid function studies: Secondary | ICD-10-CM | POA: Diagnosis not present

## 2022-04-15 ENCOUNTER — Emergency Department: Admit: 2022-04-16

## 2022-04-15 DIAGNOSIS — R112 Nausea with vomiting, unspecified: Secondary | ICD-10-CM

## 2022-04-15 DIAGNOSIS — R0981 Nasal congestion: Secondary | ICD-10-CM

## 2022-04-15 NOTE — ED Provider Notes (Incomplete)
[facility] EMERGENCY DEPT  EMERGENCY DEPARTMENT ENCOUNTER      Pt Name: Joseph Giles  MRN: [id number]  Birthdate Nov 25, 2000  Date/Time of evaluation: 04/15/2022  Provider evaluation time: 04/15/22 2205  Provider: Anderson Malta, MD    CHIEF COMPLAINT       Chief Complaint   Patient presents with   . Emesis     Pt presents to triage in wheelchair c/o N/V that started tonight         HISTORY OF PRESENT ILLNESS      Joseph Giles is a 22 y.o. male who presents to the emergency department with n/v, chills and cough.     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  Patient denies any diarrhea.  No recent antibiotic use.  No unusual foods or water sources reported.  No travel out of the country.  No blood or bile reported in the vomitus.  He is denying any abdominal pain specifically no right upper quadrant or right lower quadrant abdominal pain.  No urinary symptoms or flank pain reported.    Patient reports generalized body aches and chills and feels dehydrated.  He has no other specific complaints at this time.    Nursing Notes were reviewed.    REVIEW OF SYSTEMS         Review of Systems   Constitutional:  Positive for chills. Negative for fever and unexpected weight change.   HENT:  Positive for congestion.    Respiratory:  Positive for cough. Negative for chest tightness and shortness of breath.    Cardiovascular:  Negative for chest pain, palpitations and leg swelling.   Gastrointestinal:  Positive for diarrhea, nausea and vomiting. Negative for abdominal pain.   Genitourinary:  Negative for flank pain.   Musculoskeletal:  Negative for back pain.   Skin:  Negative for rash.   Neurological:  Negative for seizures, weakness and headaches.   Psychiatric/Behavioral:  Negative for confusion.        Except as noted  above the remainder of the review of systems was reviewed and negative.       PAST MEDICAL HISTORY   No past medical history on file.      SURGICAL HISTORY     No past surgical history on file.      CURRENT MEDICATIONS       Previous Medications    BICTEGRAVIR-EMTRICITAB-TENOFOV (BIKTARVY PO)    Take by mouth       ALLERGIES     Peanut-containing drug products    FAMILY HISTORY     No family history on file.       SOCIAL HISTORY       Social History     Socioeconomic History   . Marital status: Single       SCREENINGS         Glasgow Coma Scale  Eye Opening: Spontaneous  Best Verbal Response: Oriented  Best Motor Response: Obeys commands  Glasgow Coma Scale Score: 15                     CIWA Assessment  BP: 127/85  Pulse: 99                 PHYSICAL EXAM         ED  Triage Vitals [04/15/22 2216]   BP Temp Temp Source Pulse Respirations SpO2 Height Weight - Scale   127/85 99 F (37.2 C) Oral 99 18 98 % 1.829 m (6') 68.9 kg (152 lb)       Physical Exam  Vitals and nursing note reviewed.   Constitutional:       General: He is not in acute distress.     Appearance: He is ill-appearing. He is not toxic-appearing.   HENT:      Head: Atraumatic.      Nose: Congestion present.   Cardiovascular:      Rate and Rhythm: Normal rate and regular rhythm.      Pulses: Normal pulses.   Pulmonary:      Effort: Pulmonary effort is normal. No respiratory distress.      Breath sounds: Normal breath sounds.   Abdominal:      General: Abdomen is flat.      Palpations: Abdomen is soft.      Tenderness: There is no abdominal tenderness. There is no guarding or rebound.   Musculoskeletal:         General: Normal range of motion.   Skin:     General: Skin is warm and dry.   Neurological:      General: No focal deficit present.      Mental Status: He is alert.   Psychiatric:         Mood and Affect: Mood normal.       DIAGNOSTIC RESULTS       RADIOLOGY:   Non-plain film images such as CT, Ultrasound and MRI are read by the radiologist. Plain  radiographic images are visualized and preliminarily interpreted by the emergency physician with the below findings:    ***    Interpretation per the Radiologist below, if available at the time of this note:    XR CHEST (2 VW)    (Results Pending)           LABS:  Labs Reviewed   COVID-19 & INFLUENZA COMBO ([facility])   CBC WITH AUTO DIFFERENTIAL   COMPREHENSIVE METABOLIC PANEL   LIPASE   URINALYSIS W/ RFLX MICROSCOPIC       All other labs were within normal range or not returned as of this dictation.    EMERGENCY DEPARTMENT COURSE and DIFFERENTIAL DIAGNOSIS/MDM:   Vitals:    Vitals:    04/15/22 2216   BP: 127/85   Pulse: 99   Resp: 18   Temp: 99 F (37.2 C)   TempSrc: Oral   SpO2: 98%   Weight: 68.9 kg (152 lb)   Height: 1.829 m (6')       ***  Medical Decision Making  Patient with history as above presented with vomiting. History obtained from patient and friend.    Patient was nontoxic, stable.      Labs reviewed.  Independently reviewed imaging.   Reviewed external records.     This patient with nausea and vomiting which is likely secondary to benign cause. Considered but low risk for SBO (normal BM, passing flatus, no abdominal surgeries), no signs of DKA in labs. Patient BMP without significant electrolyte disturbance or AKI.     Patient with no chest pain. Based on history, exam, and work up low suspicion for pancreatitis, appendicitis, biliary pathology, or other emergent problem    Disposition: Discussed need to follow up diagnostics, including incidental findings. Discharged with instructions to obtain outpatient follow up of patient's symptoms and  findings, with strict return precautions if patient develops new or worsening symptoms.      Amount and/or Complexity of Data Reviewed  Labs: ordered.  Radiology: ordered.    Risk  Prescription drug management.           REASSESSMENT              CONSULTS:  None    PROCEDURES:  Unless otherwise noted below, none     Procedures        FINAL IMPRESSION    No  diagnosis found.      DISPOSITION/PLAN   DISPOSITION        PATIENT REFERRED TO:  No follow-up provider specified.    DISCHARGE MEDICATIONS:  New Prescriptions    No medications on file       (Please note that portions of this note were completed with a voice recognition program.  Efforts were made to edit the dictations but occasionally words are mis-transcribed.)    Anderson Malta, MD (electronically signed)  Attending Emergency Physician

## 2022-04-16 ENCOUNTER — Inpatient Hospital Stay
Admit: 2022-04-16 | Discharge: 2022-04-16 | Disposition: A | Discharge status: Home / Self Care | Attending: Emergency Medicine

## 2022-04-16 ENCOUNTER — Inpatient Hospital Stay: Admit: 2022-04-16 | Discharge: 2022-04-16 | Disposition: A | Discharge status: Home / Self Care

## 2022-04-16 DIAGNOSIS — R0789 Other chest pain: Secondary | ICD-10-CM

## 2022-04-16 LAB — COMPREHENSIVE METABOLIC PANEL
ALT: 25 U/L (ref 0–50)
AST: 29 U/L (ref 0–50)
Albumin/Globulin Ratio: 1.9 (ref 1.00–2.70)
Albumin: 4.8 g/dL (ref 3.5–5.2)
Alk Phosphatase: 53 U/L (ref 40–130)
Anion Gap: 12 mmol/L (ref 2–17)
BUN: 12 mg/dL (ref 6–20)
CALCIUM,CORRECTED,CCA: 8 mg/dL — ABNORMAL LOW (ref 8.6–10.0)
CO2: 22 mmol/L (ref 22–29)
Calcium: 8.7 mg/dL (ref 8.6–10.0)
Chloride: 104 mmol/L (ref 98–107)
Creatinine: 0.7 mg/dL (ref 0.7–1.3)
Est, Glom Filt Rate: 134 mL/min/1.73m (ref >=60)
Globulin: 2.5 g/dL (ref 1.9–4.4)
Glucose: 118 mg/dL — ABNORMAL HIGH (ref 70–99)
OSMOLALITY CALCULATED: 277 mOsm/kg (ref 270–287)
Potassium: 3.8 mmol/L (ref 3.5–5.3)
Sodium: 138 mmol/L (ref 135–145)
Total Bilirubin: 0.72 mg/dL (ref 0.00–1.20)
Total Protein: 7.3 g/dL (ref 6.4–8.3)

## 2022-04-16 LAB — CBC WITH AUTO DIFFERENTIAL
Absolute Baso #: 0 10*3/uL (ref 0.0–0.2)
Absolute Eos #: 0.1 10*3/uL (ref 0.0–0.5)
Absolute Lymph #: 0.7 10*3/uL — ABNORMAL LOW (ref 1.0–3.2)
Absolute Mono #: 0.5 10*3/uL (ref 0.3–1.0)
Basophils %: 0.2 % (ref 0.0–2.0)
Eosinophils %: 0.6 % (ref 0.0–7.0)
Hematocrit: 43.2 % (ref 38.0–52.0)
Hemoglobin: 14.4 g/dL (ref 13.0–17.3)
Immature Grans (Abs): 0.04 10*3/uL (ref 0.00–0.06)
Immature Granulocytes: 0.4 % (ref 0.0–0.6)
Lymphocytes: 6.5 % — ABNORMAL LOW (ref 15.0–45.0)
MCH: 29.6 pg (ref 27.0–34.5)
MCHC: 33.3 g/dL (ref 32.0–36.0)
MCV: 88.7 fL (ref 84.0–100.0)
MPV: 9.9 fL (ref 7.2–13.2)
Monocytes: 4.8 % (ref 4.0–12.0)
NRBC Absolute: 0 10*3/uL (ref 0.000–0.012)
NRBC Automated: 0 % (ref 0.0–0.2)
Neutrophils %: 87.5 % — ABNORMAL HIGH (ref 42.0–74.0)
Neutrophils Absolute: 9.1 10*3/uL — ABNORMAL HIGH (ref 1.6–7.3)
Platelets: 192 10*3/uL (ref 140–440)
RBC: 4.87 x10e6/mcL (ref 4.00–5.60)
RDW: 12.2 % (ref 11.0–16.0)
WBC: 10.4 10*3/uL (ref 3.8–10.6)

## 2022-04-16 LAB — EKG 12-LEAD
P Axis: 70 degrees
P-R Interval: 160 ms
Q-T Interval: 346 ms
QRS Duration: 94 ms
QTc Calculation (Bazett): 363 ms
R Axis: 61 degrees
T Axis: 0 degrees
Ventricular Rate: 68 {beats}/min

## 2022-04-16 LAB — COVID-19 & INFLUENZA COMBO (LIAT HOSPITAL)
INFLUENZA A: NOT DETECTED
INFLUENZA B: NOT DETECTED
SARS-CoV-2: NOT DETECTED

## 2022-04-16 LAB — LIPASE: Lipase: 20 U/L (ref 13–60)

## 2022-04-16 MED ORDER — KETOROLAC TROMETHAMINE 15 MG/ML IJ SOLN
15 MG/ML | Freq: Once | INTRAMUSCULAR | Status: AC
Start: 2022-04-16 — End: 2022-04-15
  Administered 2022-04-16: 04:00:00 15 mg via INTRAVENOUS

## 2022-04-16 MED ORDER — HYDROXYZINE HCL 25 MG PO TABS
25 MG | ORAL_TABLET | Freq: Three times a day (TID) | ORAL | 0 refills | Status: AC | PRN
Start: 2022-04-16 — End: 2022-04-26

## 2022-04-16 MED ORDER — ONDANSETRON HCL 4 MG/2ML IJ SOLN
4 MG/2ML | Freq: Once | INTRAMUSCULAR | Status: AC
Start: 2022-04-16 — End: 2022-04-15
  Administered 2022-04-16: 04:00:00 4 mg via INTRAVENOUS

## 2022-04-16 MED ORDER — ONDANSETRON 4 MG PO TBDP
4 MG | ORAL_TABLET | Freq: Three times a day (TID) | ORAL | 0 refills | Status: AC | PRN
Start: 2022-04-16 — End: 2022-04-23

## 2022-04-16 MED ORDER — DIPHENHYDRAMINE HCL 50 MG/ML IJ SOLN
50 MG/ML | Freq: Once | INTRAMUSCULAR | Status: AC
Start: 2022-04-16 — End: 2022-04-16
  Administered 2022-04-16: 18:00:00 25 mg via INTRAMUSCULAR

## 2022-04-16 MED ORDER — SODIUM CHLORIDE 0.9 % IV BOLUS
0.9 % | Freq: Once | INTRAVENOUS | Status: AC
Start: 2022-04-16 — End: 2022-04-16
  Administered 2022-04-16: 04:00:00 1000 mL via INTRAVENOUS

## 2022-04-16 MED FILL — DIPHENHYDRAMINE HCL 50 MG/ML IJ SOLN: 50 MG/ML | INTRAMUSCULAR | Qty: 1

## 2022-04-16 MED FILL — ONDANSETRON HCL 4 MG/2ML IJ SOLN: 4 MG/2ML | INTRAMUSCULAR | Qty: 2

## 2022-04-16 MED FILL — KETOROLAC TROMETHAMINE 15 MG/ML IJ SOLN: 15 MG/ML | INTRAMUSCULAR | Qty: 1

## 2022-04-16 NOTE — ED Provider Notes (Signed)
[facility] EM[facility]Y DEPT  EMERGENCY DEPARTMENT ENCOUNTER      Pt Name: Joseph Giles  MRN: [id number]  [place] 21-Sep-2000  Date of evaluation: 04/16/2022  Provider: Jalene Mullet, MD    CHIEF COMPLAINT       Chief Complaint   Patient presents with    Chest Pain     Pt c/o chest pain and sob. PT states this startes after smoking a "pre-rolled"  pt also c/o n/v. Pt seen last nigh for the same thing.          HISTORY OF PRESENT ILLNESS    The history is provided by the patient and medical records.       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  He was seen at another [place] yesterday evening with similar complaints in addition to some nonbilious emesis.  Workup was negative but he complains of ongoing symptoms and states "I just do not know what is wrong with me."  Endorses occasional sharp chest pains.  Or any pain or swelling.  No recent acceleration of dyspepsia.  No fevers.    Nursing Notes were reviewed.    REVIEW OF SYSTEMS                                                                      Review of Systems    Except as noted above the remainder of the review of systems was reviewed and negative.       PAST MEDICAL HISTORY   History reviewed. No pertinent past medical history.    SURGICAL HISTORY     History reviewed. No pertinent surgical history.    CURRENT MEDICATIONS       Previous Medications    BICTEGRAVIR-EMTRICITAB-TENOFOV (BIKTARVY PO)    Take by mouth    ONDANSETRON (ZOFRAN-ODT) 4 MG DISINTEGRATING TABLET    Place 1 tablet under the tongue every 8 hours as needed for Nausea or Vomiting May Sub regular tablet (non-ODT) if insurance does not cover ODT.       ALLERGIES     Peanut-containing drug products    FAMILY HISTORY     No family history on file.     SOCIAL HISTORY       Social History     Socioeconomic History    Marital status: Single     Spouse name: None    Number of children: None    Years of education: None    Highest  education level: None   Vaping Use    Vaping Use: Every day   Substance and Sexual Activity    Drug use: Yes     Types: Other-see comments     Comment: THC, Delta 8       SCREENINGS       PHYSICAL EXAM       ED Triage Vitals [04/16/22 1215]   BP Temp Temp src Pulse Respirations SpO2 Height Weight - Scale   134/77 97.9 F (36.6 C) -- (!) 104 18 100 % 1.829 m (6') 68.9 kg (152 lb)       Gen:  Alert, not ill-appearing  VS: Vital signs noted above but heart rate was actually 82 on clinical  examination  HEENT: No visible trauma, mucous membranes moist  Neck: No range of motion limitation.  No thyromegaly  Cardiovascular: Regular rate and rhythm  Lungs: No respiratory distress, O2 sat 100% on room air which is normal  Abdomen: Soft, nondistended, nontender  Musculoskeletal:  No visible deformity. No range of motion deficit.  No dependent edema  Neurologic: Alert, normal gait, no focal motor or sensory deficits.   Skin: Warm and dry, no visible injury.    DIAGNOSTIC RESULTS     EKG: All EKG's are interpreted by the Emergency Department Physician who either signs or Co-signs this chart in the absence of a cardiologist.    RADIOLOGY    Non-plain film images such as CT, Ultrasound and MRI are read by the radiologist. Plain radiographic images are visualized and preliminarily interpreted by the emergency physician with the below findings:    Interpretation per the Radiologist below, if available at the time of this note:    No orders to display       LABS:  Labs Reviewed - No data to display    All other labs were within normal range or not returned as of this dictation.    EMERGENCY DEPARTMENT COURSE/REASSESSMENT and MDM:   Medical Decision Making  Amount and/or Complexity of Data Reviewed  ECG/medicine tests: ordered and independent interpretation performed. Decision-making details documented in ED Course.    Risk  Prescription drug management.        ED Course as of 04/16/22 1418   Tue Apr 16, 2022   1251 Twelve-lead EKG,  interpreted by me  Indication: Chest pain  Rate: 68  Rhythm: Normal Sinus  Intervals: Within normal limits  QRS: Normal morphology  Axis: Normal   ST segment: Nonspecific repolarization abnormality, no acute current of injury or ischemia  T waves: Normal   Interpretation: Abnormal but nonspecific EKG, no previous [RE]   1414 Resting comfortably.  Generally reassuring examination.  No clinical shortness of breath.  He exhibits no increased work of breathing or oxygen desaturations.  Suspicion for thromboembolism.  EKG unremarkable.  The rest of his workup was performed yesterday and this was unremarkable as well.  I have no specific further treatment to render.  Recommend close follow-up at a local clinic to establish ongoing care.  I will provide some hydroxyzine to use on an as-needed basis.  Discharged in satisfactory condition [RE]      ED Course User Index  [RE] Carlota Raspberry, MD       PROCEDURES   Procedures        CONSULTS:  None    FINAL IMPRESSION      1. Atypical chest pain          DISPOSITION/PLAN   DISPOSITION Decision To Discharge 04/16/2022 02:15:42 PM      PATIENT REFERRED TO:  [facility]  [address]  [place] [address]          DISCHARGE MEDICATIONS:  New Prescriptions    HYDROXYZINE HCL (ATARAX) 25 MG TABLET    Take 1 tablet by mouth every 8 hours as needed for Itching     Controlled Substances Monitoring:          No data to display                (Please note that portions of this note were completed with a voice recognition program.  Efforts were made to edit the dictations but occasionally  words are mis-transcribed.)    Jalene Mullet, MD (electronically signed)  Attending Emergency Physician           Jalene Mullet, MD  04/16/22 [phone]

## 2023-02-06 ENCOUNTER — Ambulatory Visit: Admit: 2023-02-06 | Discharge: 2023-02-06

## 2023-02-06 VITALS — BP 120/72 | HR 61 | Resp 16 | Ht 71.0 in | Wt 146.0 lb

## 2023-02-06 DIAGNOSIS — R55 Syncope and collapse: Secondary | ICD-10-CM

## 2023-02-06 NOTE — Progress Notes (Signed)
 Review of Systems   Constitutional:  Negative for chills, fatigue, fever and unexpected weight change.   HENT:  Negative for hearing loss, nosebleeds, sore throat, tinnitus and voice change.    Eyes:  Negative for visual disturbance.   Respiratory:  [place]

## 2023-02-06 NOTE — Progress Notes (Signed)
 Date:  February 06, 2023  Patient name: Joseph Giles  Date of Birth: [birth year]    CARDIOLOG

## 2023-02-17 ENCOUNTER — Ambulatory Visit
